# Patient Record
Sex: Female | Born: 1985 | Race: White | Hispanic: No | Marital: Single | Smoking: Never smoker
Health system: Southern US, Community
[De-identification: ages and names within clinical notes are randomized; demographics above are authoritative.]

---

## 2018-08-25 ENCOUNTER — Emergency Department (HOSPITAL_BASED_OUTPATIENT_CLINIC_OR_DEPARTMENT_OTHER)
Admission: EM | Admit: 2018-08-25 | Discharge: 2018-08-25 | Disposition: A | Discharge status: Home / Self Care | Payer: Self-pay | Attending: Emergency Medicine | Admitting: Emergency Medicine

## 2018-08-25 ENCOUNTER — Encounter (HOSPITAL_BASED_OUTPATIENT_CLINIC_OR_DEPARTMENT_OTHER): Payer: Self-pay

## 2018-08-25 ENCOUNTER — Emergency Department (HOSPITAL_BASED_OUTPATIENT_CLINIC_OR_DEPARTMENT_OTHER): Payer: Self-pay

## 2018-08-25 ENCOUNTER — Other Ambulatory Visit: Payer: Self-pay

## 2018-08-25 DIAGNOSIS — Y999 Unspecified external cause status: Secondary | ICD-10-CM | POA: Insufficient documentation

## 2018-08-25 DIAGNOSIS — Y92000 Kitchen of unspecified non-institutional (private) residence as  the place of occurrence of the external cause: Secondary | ICD-10-CM | POA: Insufficient documentation

## 2018-08-25 DIAGNOSIS — W260XXA Contact with knife, initial encounter: Secondary | ICD-10-CM | POA: Insufficient documentation

## 2018-08-25 DIAGNOSIS — Y93G1 Activity, food preparation and clean up: Secondary | ICD-10-CM | POA: Insufficient documentation

## 2018-08-25 DIAGNOSIS — S61412A Laceration without foreign body of left hand, initial encounter: Secondary | ICD-10-CM | POA: Insufficient documentation

## 2018-08-25 MED ORDER — LIDOCAINE-EPINEPHRINE (PF) 2 %-1:200000 IJ SOLN
10.0000 mL | Freq: Once | INTRAMUSCULAR | Status: AC
  Administered 2018-08-25: 10 mL
  Filled 2018-08-25 (×2): qty 10

## 2018-08-25 NOTE — Discharge Instructions (Signed)
°  Wound Care - Laceration You may remove the bandage after 24 hours. Clean the wound and surrounding area gently with tap water and mild soap. Rinse well and blot dry. Do not scrub the wound, as this may cause the wound edges to come apart. You may shower, but avoid submerging the wound, such as with a bath or swimming. Clean the wound daily to prevent infection. Do not use cleaners such as hydrogen peroxide or alcohol.   Scar reduction: Application of a topical antibiotic ointment, such as Neosporin, after the wound has begun to close and heal well can decrease scab formation and reduce scarring. After the wound has healed and wound closures have been removed, application of ointments such as Aquaphor can also reduce scar formation.  The key to scar reduction is keeping the skin well hydrated and supple. Drinking plenty of water throughout the day (At least eight 8oz glasses of water a day) is essential to staying well hydrated.  Sun exposure: Keep the wound out of the sun. After the wound has healed, continue to protect it from the sun by wearing protective clothing or applying sunscreen.  Pain: You may use Tylenol, naproxen, or ibuprofen for pain.  Suture/staple removal: These sutures should dissolve and fall out, however, if they are still present in 10 days, return to the ED for suture removal.  Return to the ED sooner should the wound edges come apart or signs of infection arise, such as spreading redness, puffiness/swelling, pus draining from the wound, severe increase in pain, fever over 100.85F, or any other major issues.  For prescription assistance, may try using prescription discount sites or apps, such as goodrx.com

## 2018-08-25 NOTE — ED Triage Notes (Addendum)
Pt cut left hand with knife while washing dishes-lac noted-bleeding controlled-pt has bandaid in place and agreeable to leave on until in tx area-NAD-steady gait-RN interpreter used for pt

## 2018-08-25 NOTE — ED Provider Notes (Signed)
[facility] EMERGENCY DEPARTMENT Provider Note   CSN: [id number] Arrival date & time: 08/25/18  1820     History   Chief Complaint Chief Complaint  Patient presents with  . Hand Injury    HPI Alexis Nguyen is a 33 y.o. female.  HPI   Alexis Nguyen is a 33 y.o. female, patient with no pertinent past medical history, presenting to the ED with laceration to the palm of the left hand that occurred this afternoon. Patient accidentally cut her hand with a clean knife. Tetanus up-to-date.  Denies numbness, weakness, other injuries or any other complaints.  History reviewed. No pertinent past medical history.  There are no active problems to display for this patient.   History reviewed. No pertinent surgical history.   OB History   No obstetric history on file.      Home Medications    Prior to Admission medications   Not on File    Family History No family history on file.  Social History Social History   Tobacco Use  . Smoking status: Never Smoker  . Smokeless tobacco: Never Used  Substance Use Topics  . Alcohol use: Yes    Frequency: Never    Comment: weekly  . Drug use: Never     Allergies   Patient has no known allergies.   Review of Systems Review of Systems  Skin: Positive for wound.  Neurological: Negative for weakness and numbness.     Physical Exam Updated Vital Signs BP 108/73 (BP Location: Left Arm)   Pulse 75   Temp 98.3 F (36.8 C) (Oral)   Resp 18   Wt 50.3 kg   LMP 08/11/2018   SpO2 100%   Physical Exam Vitals signs and nursing note reviewed.  Constitutional:      General: She is not in acute distress.    Appearance: She is well-developed. She is not diaphoretic.  HENT:     Head: Normocephalic and atraumatic.  Eyes:     Conjunctiva/sclera: Conjunctivae normal.  Neck:     Musculoskeletal: Neck supple.  Cardiovascular:     Rate and Rhythm: Normal rate and regular rhythm.     Pulses:      Radial pulses are 2+ on the left side.  Pulmonary:     Effort: Pulmonary effort is normal.  Skin:    General: Skin is warm and dry.     Capillary Refill: Capillary refill takes less than 2 seconds.     Coloration: Skin is not pale.     Comments: Approximately 1 cm laceration to the left palm.  Neurological:     Mental Status: She is alert.     Comments: Full flexion and extension against resistance intact in the left MCP, PIP, and DIP joints of the fingers. Patient can make a fist and touch the thumb to the base of each of the fingers without difficulty. Sensation grossly intact to light touch in the hand and fingers.  Psychiatric:        Behavior: Behavior normal.      ED Treatments / Results  Labs (all labs ordered are listed, but only abnormal results are displayed) Labs Reviewed - No data to display  EKG None  Radiology Dg Hand Complete Left  Result Date: 08/25/2018 CLINICAL DATA:  Laceration to palm a hand with knife. EXAM: LEFT HAND - COMPLETE 3+ VIEW COMPARISON:  None. FINDINGS: There is no evidence of fracture or dislocation. There is no evidence of arthropathy or other focal  bone abnormality. Soft tissues are unremarkable. IMPRESSION: No foreign body or fracture identified. Electronically Signed   By: Gerome Sam III M.D   On: 08/25/2018 20:14    Procedures .Marland KitchenLaceration Repair Date/Time: 08/25/2018 8:35 PM Performed by: Anselm Pancoast, PA-C Authorized by: Anselm Pancoast, PA-C   Consent:    Consent obtained:  Verbal   Consent given by:  Patient   Risks discussed:  Infection, need for additional repair, nerve damage, poor cosmetic result, poor wound healing, pain and retained foreign body Anesthesia (see MAR for exact dosages):    Anesthesia method:  Local infiltration   Local anesthetic:  Lidocaine 2% WITH epi Laceration details:    Location:  Hand   Hand location:  L palm   Length (cm):  1 Repair type:    Repair type:  Simple Pre-procedure details:     Preparation:  Patient was prepped and draped in usual sterile fashion and imaging obtained to evaluate for foreign bodies Exploration:    Wound exploration: wound explored through full range of motion and entire depth of wound probed and visualized   Treatment:    Area cleansed with:  Betadine and saline   Amount of cleaning:  Standard   Irrigation solution:  Sterile saline   Irrigation method:  Syringe Skin repair:    Repair method:  Sutures   Suture size:  4-0   Wound skin closure material used: Vicryl.   Suture technique:  Simple interrupted   Number of sutures:  3 Approximation:    Approximation:  Close Post-procedure details:    Dressing:  Sterile dressing   Patient tolerance of procedure:  Tolerated well, no immediate complications   (including critical care time)  Medications Ordered in ED Medications  lidocaine-EPINEPHrine (XYLOCAINE W/EPI) 2 %-1:200000 (PF) injection 10 mL (10 mLs Infiltration Given by Other 08/25/18 1935)     Initial Impression / Assessment and Plan / ED Course  I have reviewed the triage vital signs and the nursing notes.  Pertinent labs & imaging results that were available during my care of the patient were reviewed by me and considered in my medical decision making (see chart for details).     Patient presents with laceration to the left palm. No acute abnormalities on imaging.  Laceration repaired without immediate complication. The patient was given instructions for home care as well as return precautions. Patient voices understanding of these instructions, accepts the plan, and is comfortable with discharge.   Final Clinical Impressions(s) / ED Diagnoses   Final diagnoses:  Laceration of left hand without foreign body, initial encounter    ED Discharge Orders    None       Concepcion Living 08/25/18 2054    Alvira Monday, MD 08/27/18 1931

## 2020-01-21 IMAGING — CR DG HAND COMPLETE 3+V*L*
3 series · 3 of 3 positions shown · non-contrast
Comparison: None.

CLINICAL DATA: Laceration to palm a hand with knife.

EXAM:
LEFT HAND - COMPLETE 3+ VIEW

[x hand pa left]
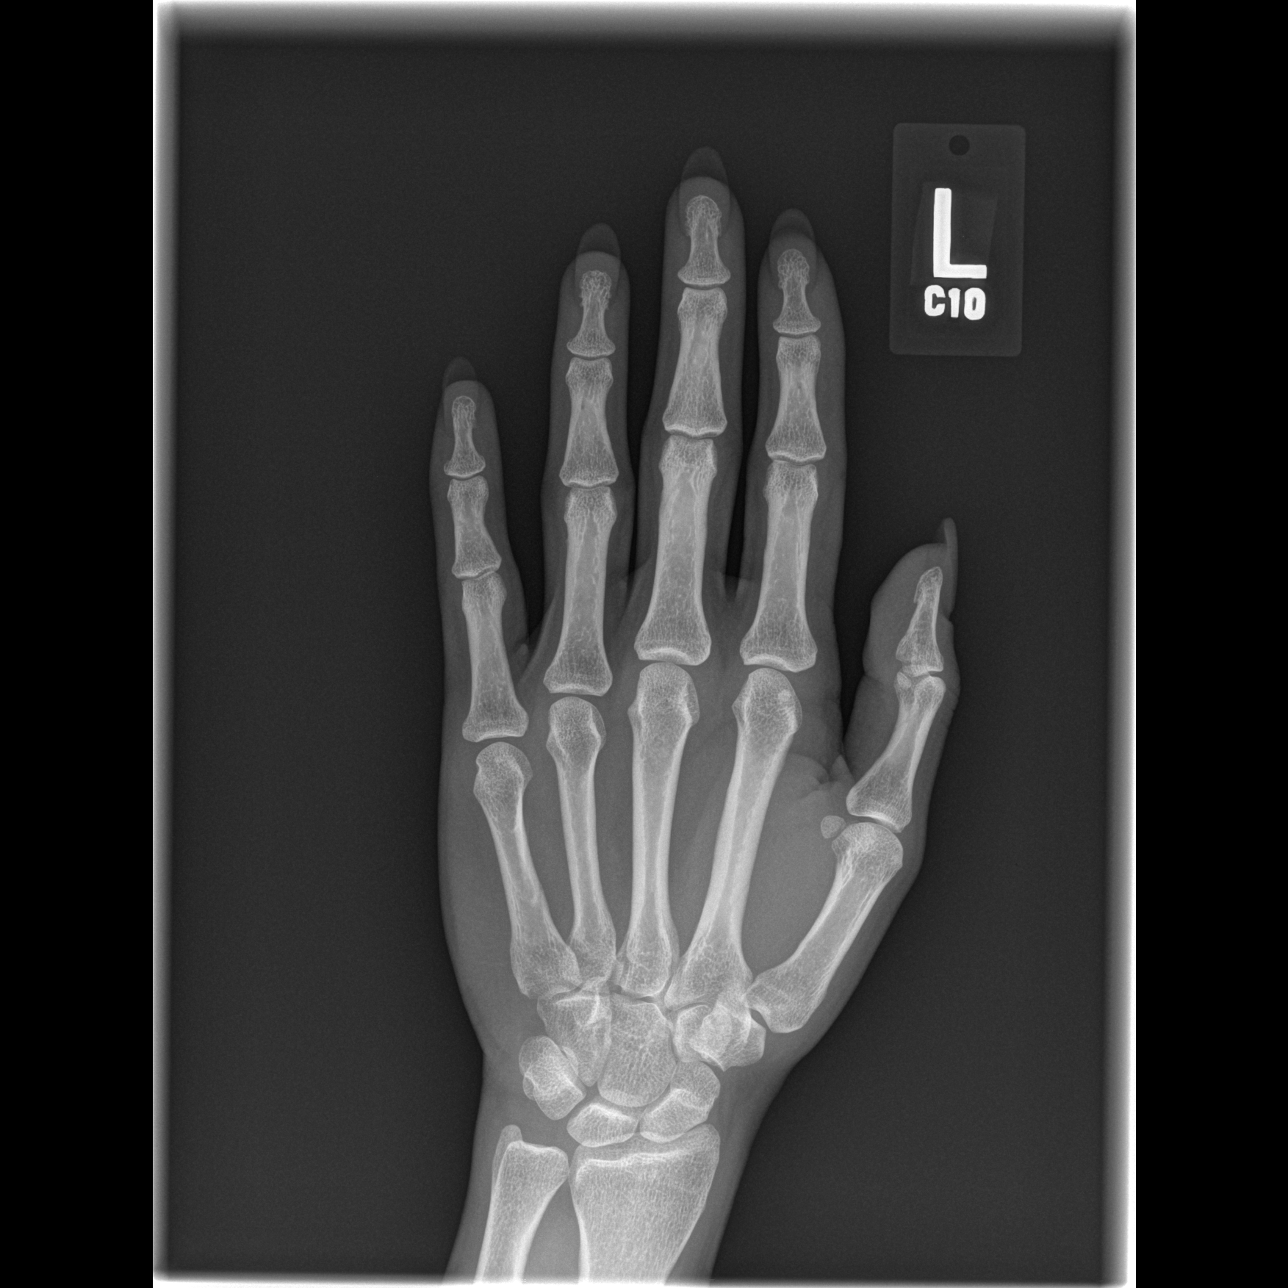

[x hand oblique left]
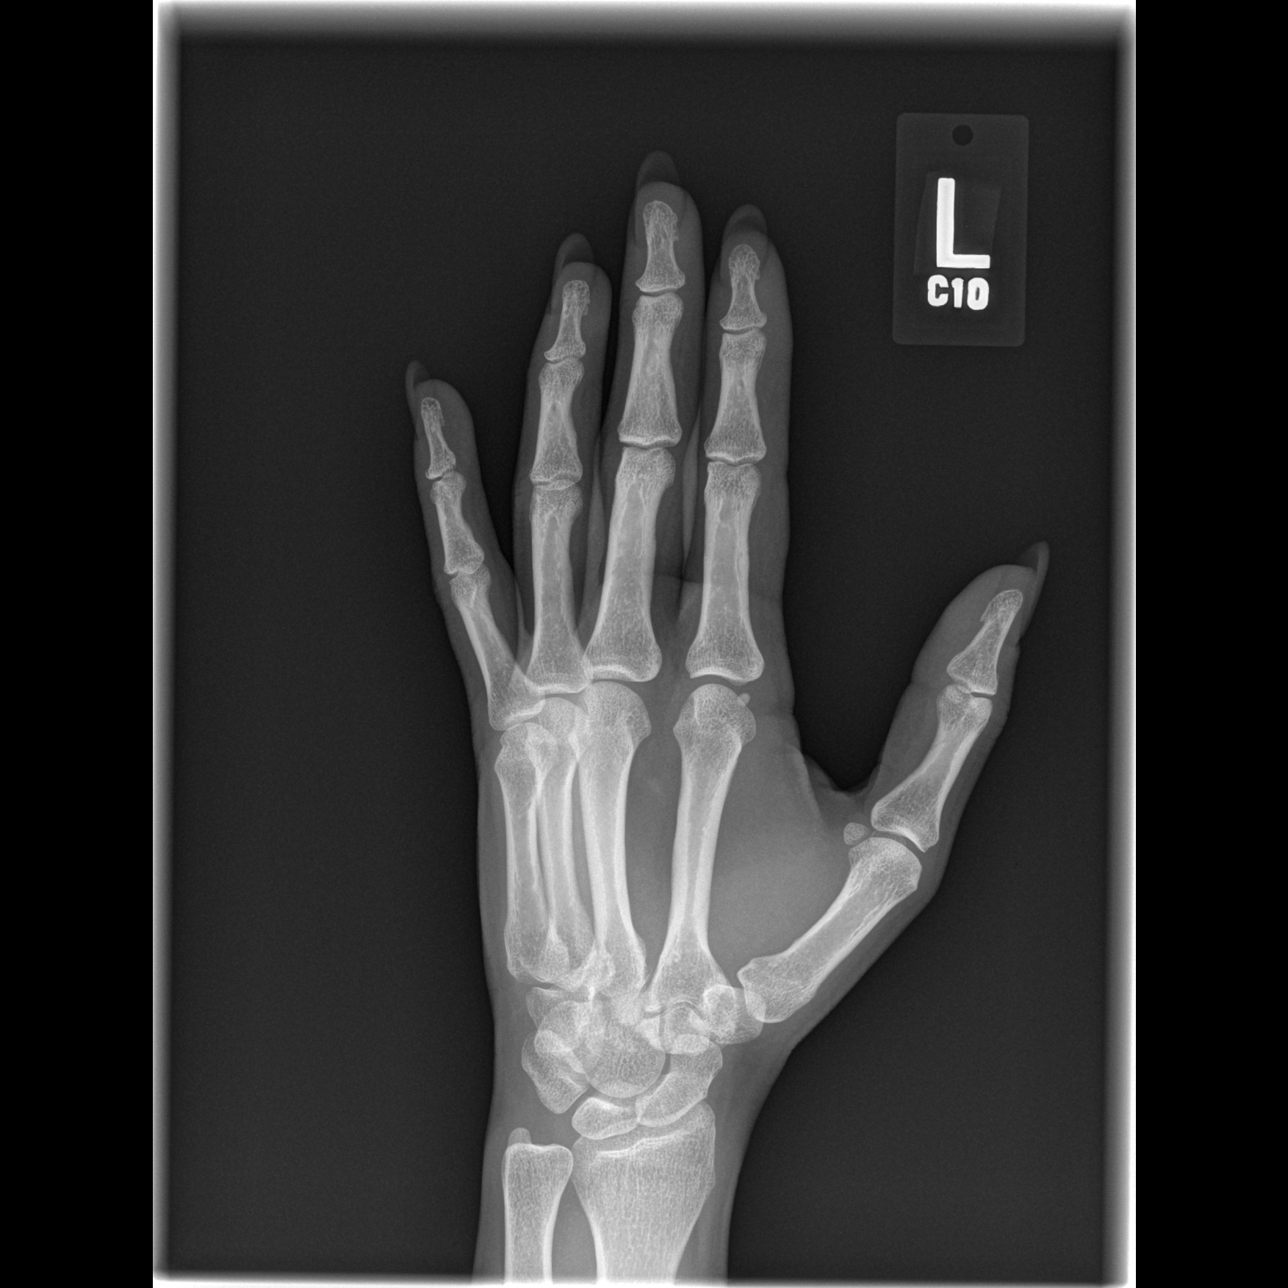

[x hand lat left]
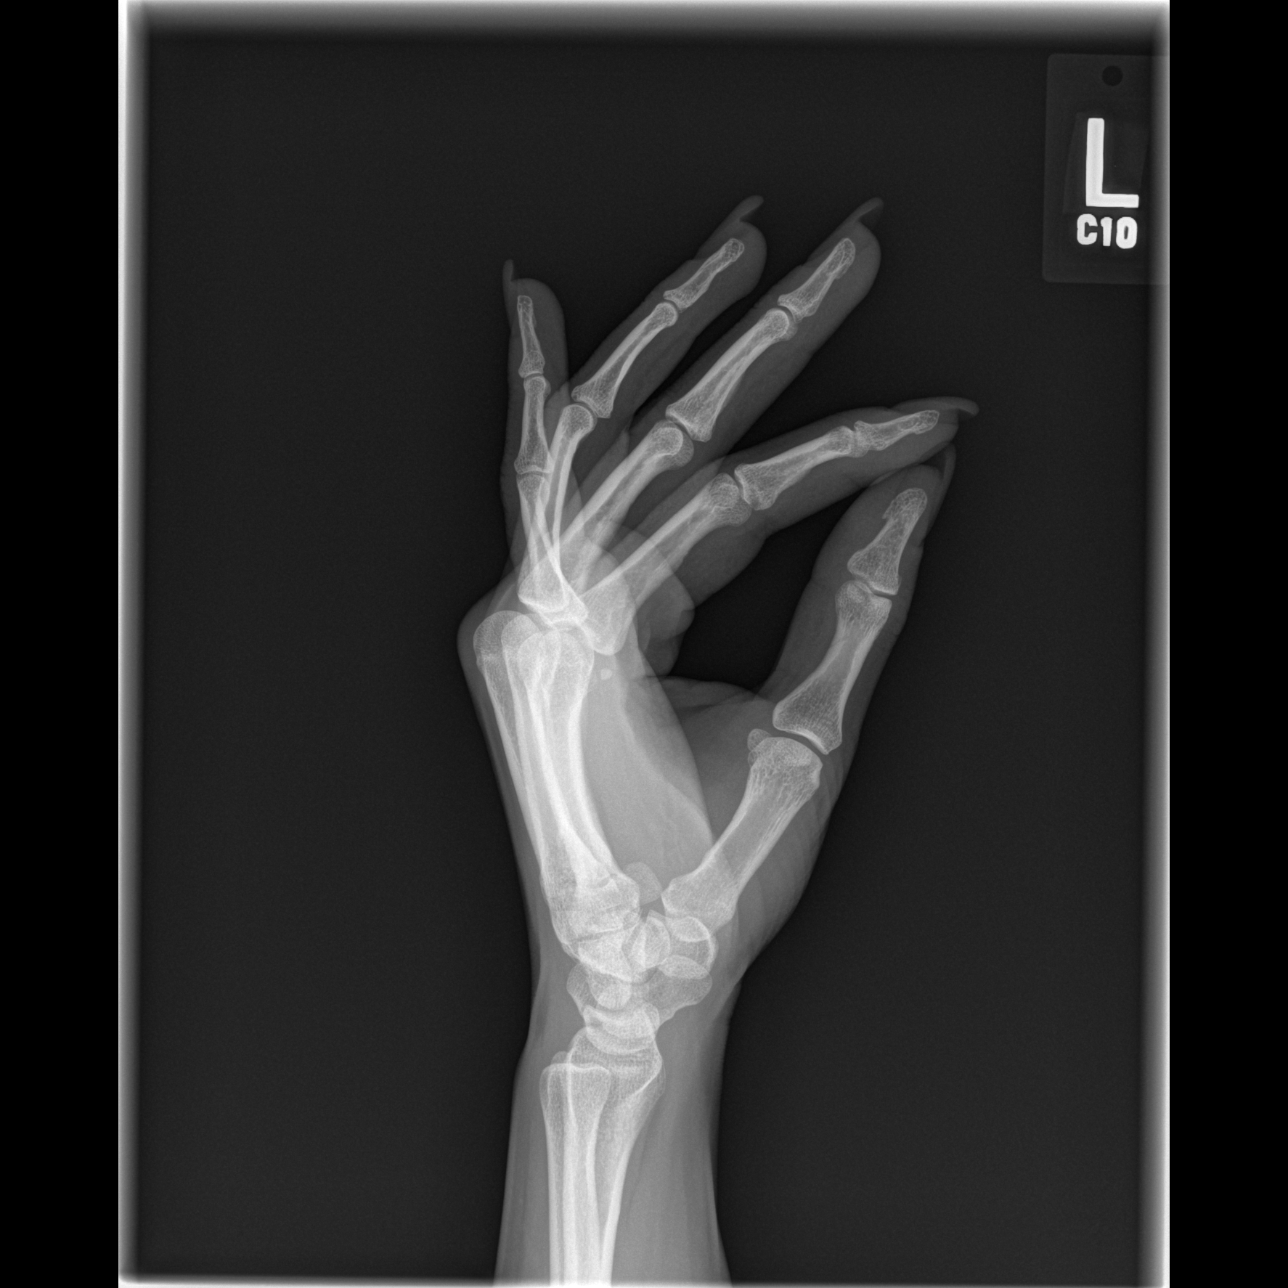

[3 of 3 positions shown; findings below may reference images not displayed]

FINDINGS: There is no evidence of fracture or dislocation. There is no
evidence of arthropathy or other focal bone abnormality. Soft
tissues are unremarkable.
IMPRESSION: No foreign body or fracture identified.
